# Patient Record
Sex: Male | Born: 2005 | Race: Black or African American | Hispanic: No | Marital: Single | Smoking: Never smoker
Health system: Southern US, Community
[De-identification: ages and names within clinical notes are randomized; demographics above are authoritative.]

## PROBLEM LIST (undated history)

## (undated) DIAGNOSIS — Q431 Hirschsprung's disease: Secondary | ICD-10-CM

---
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
## 2024-02-14 ENCOUNTER — Ambulatory Visit: Attending: Nurse Practitioner | Admitting: Physical Therapy

## 2024-02-22 ENCOUNTER — Ambulatory Visit: Admitting: Physical Therapy

## 2024-02-28 ENCOUNTER — Encounter: Admitting: Physical Therapy

## 2024-03-06 ENCOUNTER — Encounter: Admitting: Physical Therapy

## 2024-03-13 ENCOUNTER — Encounter: Admitting: Physical Therapy
